# Patient Record
Sex: Male | Born: 1980 | Race: Black or African American | Hispanic: No | Marital: Single | Smoking: Current every day smoker
Health system: Southern US, Community
[De-identification: ages and names within clinical notes are randomized; demographics above are authoritative.]

---

## 2002-09-14 ENCOUNTER — Emergency Department (HOSPITAL_COMMUNITY): Admission: EM | Admit: 2002-09-14 | Discharge: 2002-09-14 | Payer: Self-pay | Admitting: Emergency Medicine

## 2005-01-31 ENCOUNTER — Emergency Department (HOSPITAL_COMMUNITY): Admission: EM | Admit: 2005-01-31 | Discharge: 2005-01-31 | Payer: Self-pay | Admitting: Emergency Medicine

## 2005-02-21 ENCOUNTER — Encounter: Admission: RE | Admit: 2005-02-21 | Discharge: 2005-03-04 | Payer: Self-pay | Admitting: Orthopedic Surgery

## 2007-06-10 ENCOUNTER — Emergency Department (HOSPITAL_COMMUNITY): Admission: EM | Admit: 2007-06-10 | Discharge: 2007-06-10 | Payer: Self-pay | Admitting: Family Medicine

## 2007-12-29 ENCOUNTER — Emergency Department (HOSPITAL_COMMUNITY): Admission: EM | Admit: 2007-12-29 | Discharge: 2007-12-29 | Payer: Self-pay | Admitting: Emergency Medicine

## 2009-03-20 ENCOUNTER — Emergency Department (HOSPITAL_COMMUNITY): Admission: EM | Admit: 2009-03-20 | Discharge: 2009-03-20 | Payer: Self-pay | Admitting: Emergency Medicine

## 2009-12-26 ENCOUNTER — Emergency Department (HOSPITAL_COMMUNITY): Admission: EM | Admit: 2009-12-26 | Discharge: 2009-12-26 | Payer: Self-pay | Admitting: Emergency Medicine

## 2011-01-04 LAB — GC/CHLAMYDIA PROBE AMP, GENITAL
Chlamydia, DNA Probe: NEGATIVE
GC Probe Amp, Genital: NEGATIVE

## 2011-11-27 IMAGING — CR DG FOREARM 2V*L*
2 series · 2 of 2 positions shown · non-contrast
Comparison: None

CLINICAL DATA: Forearm stab wound. Stabbed by a kitchen knife.
Laceration.

LEFT FOREARM - 2 VIEW

[x forearm ap left]
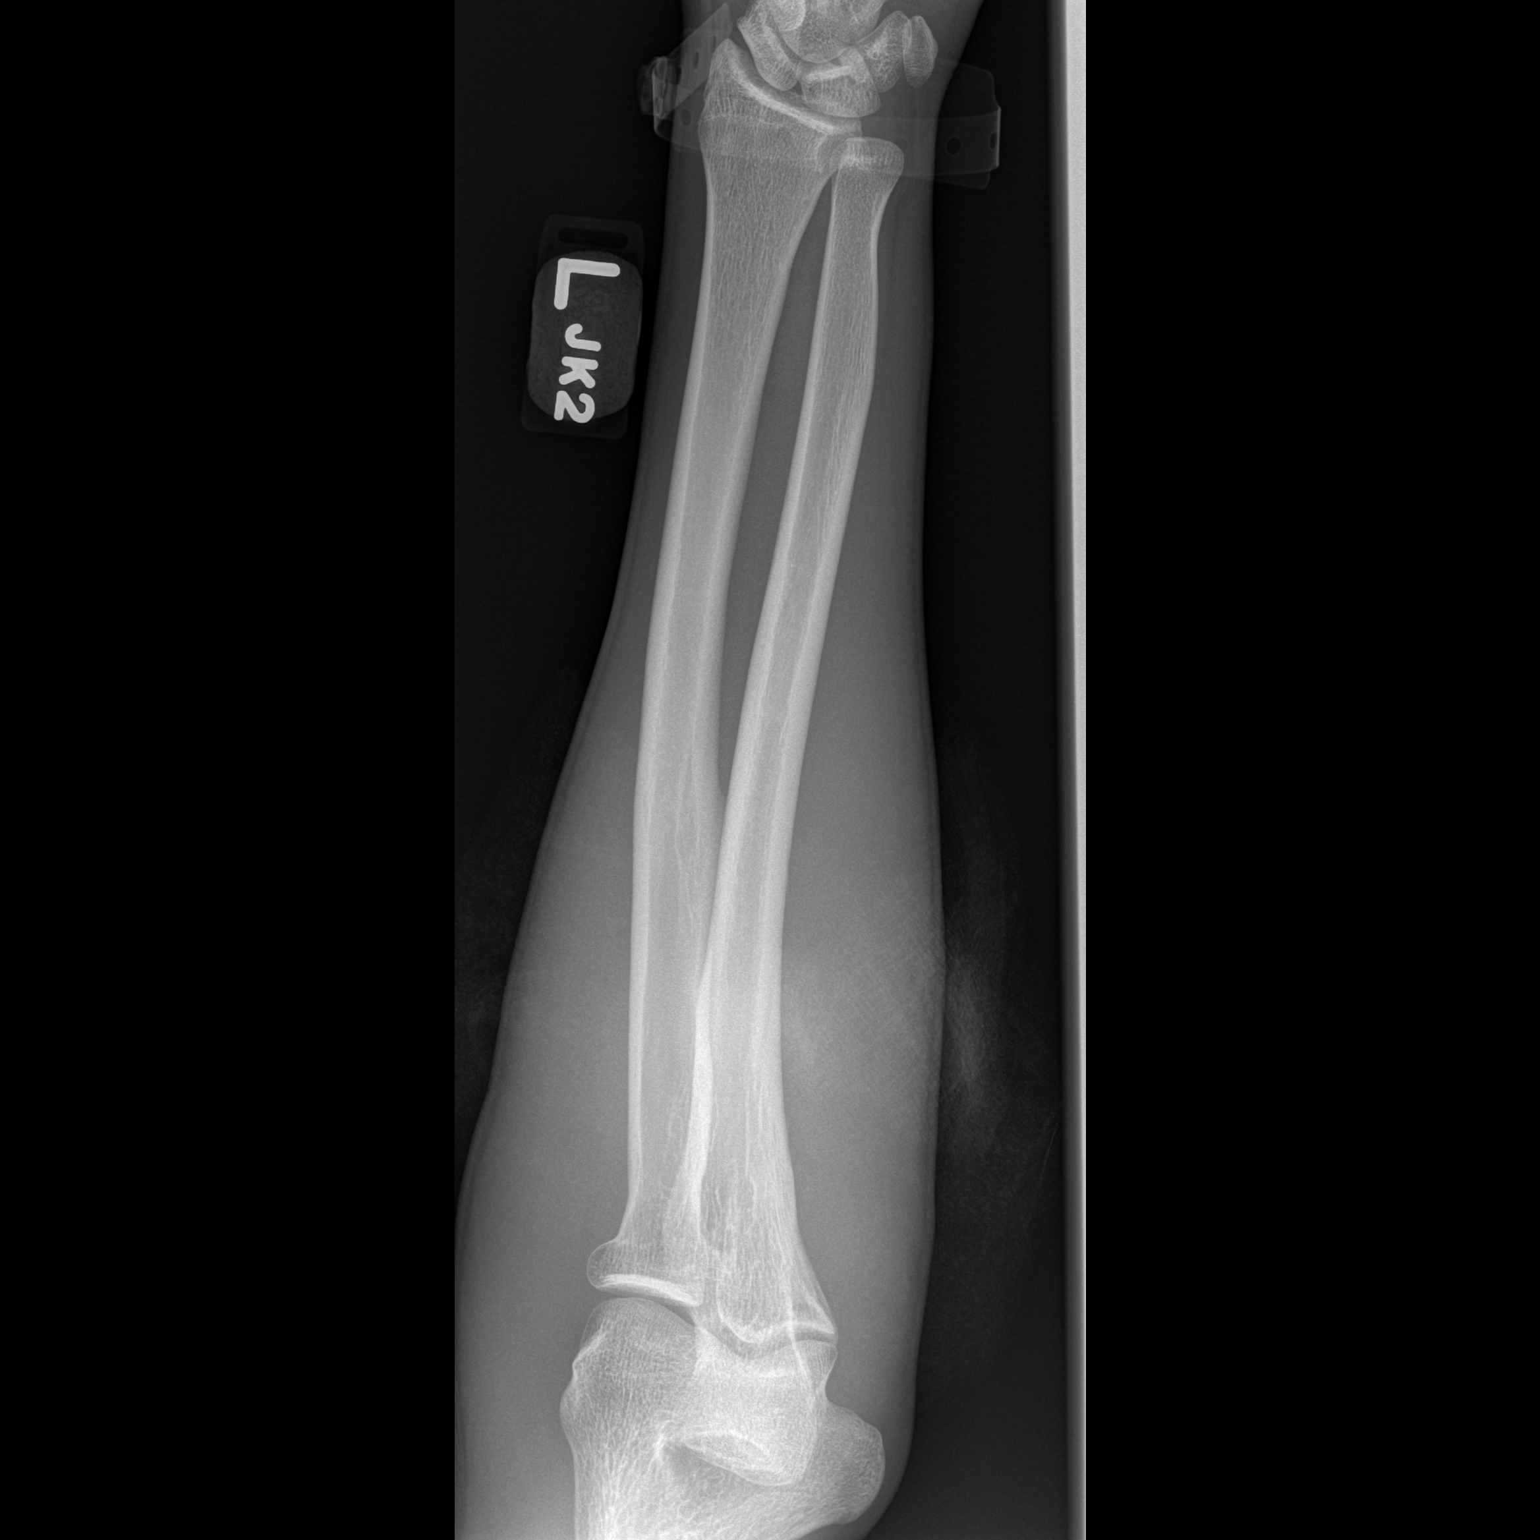

[x forearm lat left]
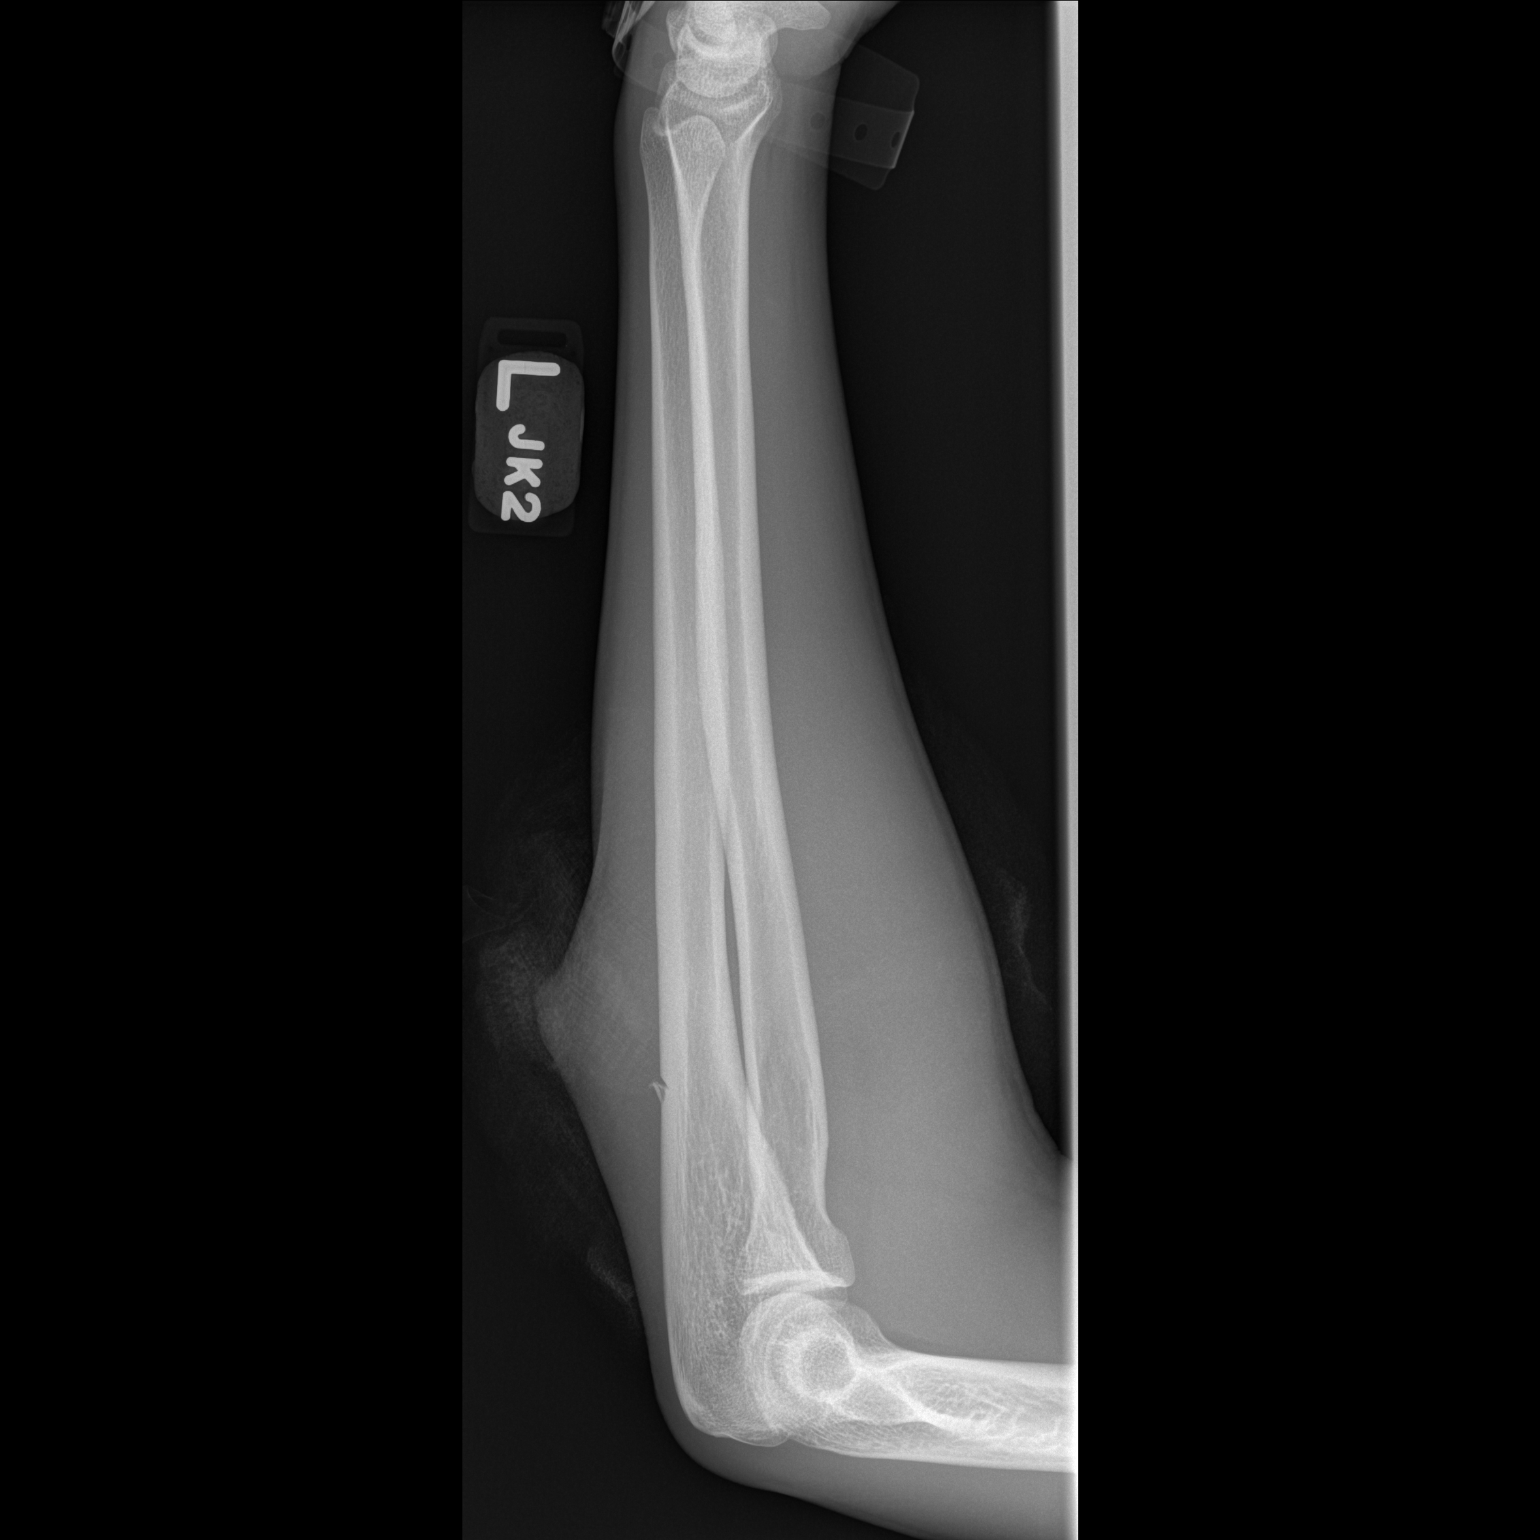

[2 of 2 positions shown; findings below may reference images not displayed]

FINDINGS: There is significant soft tissue swelling along the
dorsal aspect of the proximal forearm.  Within this region, there
is a small chip fracture of the dorsal aspect of the ulna.  This
does not appear to be a full-thickness fracture.  The radius
appears intact.  No radiopaque foreign body identified.
IMPRESSION: Laceration of the forearm associated with small chip fracture of
the ulna.

## 2013-10-29 ENCOUNTER — Emergency Department (INDEPENDENT_AMBULATORY_CARE_PROVIDER_SITE_OTHER)
Admission: EM | Admit: 2013-10-29 | Discharge: 2013-10-29 | Disposition: A | Discharge status: Home / Self Care | Payer: Self-pay | Source: Home / Self Care | Attending: Emergency Medicine | Admitting: Emergency Medicine

## 2013-10-29 ENCOUNTER — Encounter (HOSPITAL_COMMUNITY): Payer: Self-pay | Admitting: Emergency Medicine

## 2013-10-29 DIAGNOSIS — S0180XA Unspecified open wound of other part of head, initial encounter: Secondary | ICD-10-CM

## 2013-10-29 DIAGNOSIS — S01119A Laceration without foreign body of unspecified eyelid and periocular area, initial encounter: Secondary | ICD-10-CM

## 2013-10-29 DIAGNOSIS — S0181XA Laceration without foreign body of other part of head, initial encounter: Principal | ICD-10-CM

## 2013-10-29 MED ORDER — CEPHALEXIN 500 MG PO CAPS: 500.0000 mg | ORAL_CAPSULE | Freq: Three times a day (TID) | ORAL | Status: DC

## 2013-10-29 NOTE — ED Provider Notes (Signed)
CSN: [id number]     Arrival date & time 10/29/13  1932 History   First MD Initiated Contact with Patient 10/29/13 2027     Chief Complaint  Patient presents with  . Facial Laceration   (Consider location/radiation/quality/duration/timing/severity/associated sxs/prior Treatment) HPI Comments: 33 year old male presents for evaluation of a laceration above his left eye sustained while at work earlier today. He was struck in the face by a metal beam when he was walking, trying to hurry to finish his work. He did not get not felt. No pain with eye movements or blurry vision. No systemic symptoms since this happened. Bleeding controlled with direct pressure. His tetanus is up-to-date.   History reviewed. No pertinent past medical history. History reviewed. No pertinent past surgical history. History reviewed. No pertinent family history. History  Substance Use Topics  . Smoking status: Current Every Day Smoker  . Smokeless tobacco: Not on file  . Alcohol Use: No    Review of Systems  Skin: Positive for wound.  All other systems reviewed and are negative.   Allergies  Review of patient's allergies indicates no known allergies.  Home Medications   Prior to Admission medications   Medication Sig Start Date End Date Taking? Authorizing Provider  cephALEXin (KEFLEX) 500 MG capsule Take 1 capsule (500 mg total) by mouth 3 (three) times daily. 10/29/13   Adrian Blackwater Lenola Lockner, PA-C   BP 125/70  Pulse 92  Temp(Src) 99.5 F (37.5 C) (Oral)  Resp 14  SpO2 98% Physical Exam  Nursing note and vitals reviewed. Constitutional: He is oriented to person, place, and time. He appears well-developed and well-nourished. No distress.  HENT:  Head: Normocephalic. Head is with laceration (full thickness, minimal active bleeding).    No pain with palpation of the orbital rhythm. No pain with extraocular movements, and all extraocular movements intact.  Pulmonary/Chest: Effort normal. No respiratory  distress.  Neurological: He is alert and oriented to person, place, and time. Coordination normal.  Skin: Skin is warm and dry. No rash noted. He is not diaphoretic.  Psychiatric: He has a normal mood and affect. Judgment normal.    ED Course  LACERATION REPAIR Date/Time: 10/30/2013 9:18 AM Performed by: Autumn Messing, H Authorized by: Autumn Messing, H Consent: Verbal consent obtained. Risks and benefits: risks, benefits and alternatives were discussed Consent given by: patient Patient understanding: patient states understanding of the procedure being performed Patient identity confirmed: verbally with patient Time out: Immediately prior to procedure a "time out" was called to verify the correct patient, procedure, equipment, support staff and site/side marked as required. Body area: head/neck Location details: left eyebrow Laceration length: 5 cm Foreign bodies: no foreign bodies Tendon involvement: none Nerve involvement: none Vascular damage: no Anesthesia: local infiltration Local anesthetic: lidocaine 2% with epinephrine Anesthetic total: 7 ml Preparation: Patient was prepped and draped in the usual sterile fashion. Irrigation solution: saline Irrigation method: syringe Amount of cleaning: standard Debridement: none Degree of undermining: none Skin closure: 6-0 Prolene Number of sutures: 7 Technique: simple Approximation: close Approximation difficulty: simple Dressing: antibiotic ointment and 4x4 sterile gauze Patient tolerance: Patient tolerated the procedure well with no immediate complications.   (including critical care time) Labs Review Labs Reviewed - No data to display  Imaging Review No results found.   MDM   1. Laceration of eyebrow and forehead, initial encounter    Keflex for wound infection prophylaxis. Followup in 5-7 days for removal of sutures.  Meds ordered this encounter  Medications  .  cephALEXin (KEFLEX) 500 MG capsule    Sig: Take 1  capsule (500 mg total) by mouth 3 (three) times daily.    Dispense:  15 capsule    Refill:  0    Order Specific Question:  Supervising Provider    Answer:  Clementeen GrahamOREY, EVAN, Kathie RhodesS [[id number]]     Graylon GoodZachary H Jewelianna Pancoast, PA-C 10/30/13 1913

## 2013-10-29 NOTE — Discharge Instructions (Signed)
Facial Laceration  A facial laceration is a cut on the face. These injuries can be painful and cause bleeding. Lacerations usually heal quickly, but they need special care to reduce scarring. DIAGNOSIS  Your health care provider will take a medical history, ask for details about how the injury occurred, and examine the wound to determine how deep the cut is. TREATMENT  Some facial lacerations may not require closure. Others may not be able to be closed because of an increased risk of infection. The risk of infection and the chance for successful closure will depend on various factors, including the amount of time since the injury occurred. The wound may be cleaned to help prevent infection. If closure is appropriate, pain medicines may be given if needed. Your health care provider will use stitches (sutures), wound glue (adhesive), or skin adhesive strips to repair the laceration. These tools bring the skin edges together to allow for faster healing and a better cosmetic outcome. If needed, you may also be given a tetanus shot. HOME CARE INSTRUCTIONS  Only take over-the-counter or prescription medicines as directed by your health care provider.  Follow your health care provider's instructions for wound care. These instructions will vary depending on the technique used for closing the wound. For Sutures:  Keep the wound clean and dry.   If you were given a bandage (dressing), you should change it at least once a day. Also change the dressing if it becomes wet or dirty, or as directed by your health care provider.   Wash the wound with soap and water 2 times a day. Rinse the wound off with water to remove all soap. Pat the wound dry with a clean towel.   After cleaning, apply a thin layer of the antibiotic ointment recommended by your health care provider. This will help prevent infection and keep the dressing from sticking.   You may shower as usual after the first 24 hours. Do not soak the  wound in water until the sutures are removed.   Get your sutures removed as directed by your health care provider. With facial lacerations, sutures should usually be taken out after 4-5 days to avoid stitch marks.   Wait a few days after your sutures are removed before applying any makeup. For Skin Adhesive Strips:  Keep the wound clean and dry.   Do not get the skin adhesive strips wet. You may bathe carefully, using caution to keep the wound dry.   If the wound gets wet, pat it dry with a clean towel.   Skin adhesive strips will fall off on their own. You may trim the strips as the wound heals. Do not remove skin adhesive strips that are still stuck to the wound. They will fall off in time.  For Wound Adhesive:  You may briefly wet your wound in the shower or bath. Do not soak or scrub the wound. Do not swim. Avoid periods of heavy sweating until the skin adhesive has fallen off on its own. After showering or bathing, gently pat the wound dry with a clean towel.   Do not apply liquid medicine, cream medicine, ointment medicine, or makeup to your wound while the skin adhesive is in place. This may loosen the film before your wound is healed.   If a dressing is placed over the wound, be careful not to apply tape directly over the skin adhesive. This may cause the adhesive to be pulled off before the wound is healed.   Avoid   prolonged exposure to sunlight or tanning lamps while the skin adhesive is in place.  The skin adhesive will usually remain in place for 5-10 days, then naturally fall off the skin. Do not pick at the adhesive film.  After Healing: Once the wound has healed, cover the wound with sunscreen during the day for 1 full year. This can help minimize scarring. Exposure to ultraviolet light in the first year will darken the scar. It can take 1-2 years for the scar to lose its redness and to heal completely.  SEEK IMMEDIATE MEDICAL CARE IF:  You have redness, pain, or  swelling around the wound.   You see ayellowish-white fluid (pus) coming from the wound.   You have chills or a fever.  MAKE SURE YOU:  Understand these instructions.  Will watch your condition.  Will get help right away if you are not doing well or get worse. Document Released: 05/09/2004 Document Revised: 01/20/2013 Document Reviewed: 11/12/2012 Exit[employer] Patient Information 2015 [employer]. This information is not intended to replace advice given to you by your health care provider. Make sure you discuss any questions you have with your health care provider.  

## 2013-10-29 NOTE — ED Notes (Signed)
Pt  Reports  He  Was  Struck  In the   Face  By a  Metal  Beam  And  Sustained  A  Laceration  To  The  Left  eyenbrow  Area  -  He  Was  Not  Knocked  Out  No  Bleeding  He  Is  Awake  And  Alert and oriented  Bleeding  Has  Subsided

## 2013-10-31 NOTE — ED Provider Notes (Signed)
Medical screening examination/treatment/procedure(s) were performed by non-physician practitioner and as supervising physician I was immediately available for consultation/collaboration.  Dyanna Seiter, M.D.  Jolon Degante C Kamesha Herne, MD 10/31/13 0851 

## 2016-04-09 ENCOUNTER — Encounter (HOSPITAL_COMMUNITY): Payer: Self-pay | Admitting: Family Medicine

## 2016-04-09 ENCOUNTER — Ambulatory Visit (HOSPITAL_COMMUNITY)
Admission: EM | Admit: 2016-04-09 | Discharge: 2016-04-09 | Disposition: A | Discharge status: Home / Self Care | Payer: Self-pay | Attending: Emergency Medicine | Admitting: Emergency Medicine

## 2016-04-09 DIAGNOSIS — F17200 Nicotine dependence, unspecified, uncomplicated: Secondary | ICD-10-CM | POA: Insufficient documentation

## 2016-04-09 DIAGNOSIS — Z202 Contact with and (suspected) exposure to infections with a predominantly sexual mode of transmission: Secondary | ICD-10-CM | POA: Insufficient documentation

## 2016-04-09 MED ORDER — METRONIDAZOLE 500 MG PO TABS: 500.0000 mg | ORAL_TABLET | Freq: Two times a day (BID) | ORAL | 0 refills | Status: AC

## 2016-04-09 NOTE — ED Triage Notes (Signed)
Pt here for STD check.

## 2016-04-09 NOTE — ED Provider Notes (Signed)
[facility]    CSN: [id number] Arrival date & time: 04/09/16  1947     History   Chief Complaint Chief Complaint  Patient presents with  . Exposure to STD    HPI Frederick Vaughn is a 35 y.o. male.   The history is provided by the patient.  He is a 35 year old man here for STD exposure. He was contacted by a previous partner and told to get tested for trichomonas. He denies any symptoms currently.  History reviewed. No pertinent past medical history.  There are no active problems to display for this patient.   History reviewed. No pertinent surgical history.     Home Medications    Prior to Admission medications   Medication Sig Start Date End Date Taking? Authorizing Provider  metroNIDAZOLE (FLAGYL) 500 MG tablet Take 1 tablet (500 mg total) by mouth 2 (two) times daily. 04/09/16   Charm RingsErin J Talise Sligh, MD    Family History History reviewed. No pertinent family history.  Social History Social History  Substance Use Topics  . Smoking status: Current Every Day Smoker  . Smokeless tobacco: Never Used  . Alcohol use No     Allergies   Patient has no known allergies.   Review of Systems Review of Systems As in history of present illness  Physical Exam Triage Vital Signs ED Triage Vitals [04/09/16 1953]  Enc Vitals Group     BP 144/77     Pulse Rate 80     Resp 16     Temp 98.6 F (37 C)     Temp Source Oral     SpO2 99 %     Weight      Height      Head Circumference      Peak Flow      Pain Score      Pain Loc      Pain Edu?      Excl. in GC?    No data found.   Updated Vital Signs BP 144/77 (BP Location: Left Arm)   Pulse 80   Temp 98.6 F (37 C) (Oral)   Resp 16   SpO2 99%   Visual Acuity Right Eye Distance:   Left Eye Distance:   Bilateral Distance:    Right Eye Near:   Left Eye Near:    Bilateral Near:     Physical Exam  Constitutional: He is oriented to person, place, and time. He appears well-developed and  well-nourished. No distress.  Cardiovascular: Normal rate.   Pulmonary/Chest: Effort normal.  Neurological: He is alert and oriented to person, place, and time.     UC Treatments / Results  Labs (all labs ordered are listed, but only abnormal results are displayed) Labs Reviewed  URINE CYTOLOGY ANCILLARY ONLY    EKG  EKG Interpretation None       Radiology No results found.  Procedures Procedures (including critical care time)  Medications Ordered in UC Medications - No data to display   Initial Impression / Assessment and Plan / UC Course  I have reviewed the triage vital signs and the nursing notes.  Pertinent labs & imaging results that were available during my care of the patient were reviewed by me and considered in my medical decision making (see chart for details).  Clinical Course     We'll send urine for testing. Start Flagyl twice a day while awaiting results. Follow-up as needed.  Final Clinical Impressions(s) / UC Diagnoses   Final  diagnoses:  STD exposure    New Prescriptions New Prescriptions   METRONIDAZOLE (FLAGYL) 500 MG TABLET    Take 1 tablet (500 mg total) by mouth 2 (two) times daily.     Charm RingsErin J Dema Timmons, MD 04/09/16 2006

## 2016-04-09 NOTE — Discharge Instructions (Signed)
We are going to test her urine for STDs. We will get the results back in 2-3 days. Take Flagyl twice a day for 7 days. Do not drink alcohol while on this medicine. Follow-up as needed.

## 2016-04-10 LAB — URINE CYTOLOGY ANCILLARY ONLY
Chlamydia: POSITIVE — AB
Neisseria Gonorrhea: NEGATIVE
Trichomonas: NEGATIVE

## 2016-04-12 ENCOUNTER — Telehealth: Payer: Self-pay | Admitting: Internal Medicine

## 2016-04-12 MED ORDER — AZITHROMYCIN 500 MG PO TABS: 1000.0000 mg | ORAL_TABLET | Freq: Once | ORAL | 0 refills | Status: AC

## 2016-04-12 NOTE — Telephone Encounter (Signed)
Clinical staff, please let patient and health department know that test for chlamydia was positive.  Rx zithromax sent to pharmacy of record, [facility] [address] [facility] [address].  Sexual partners need to be notified and tested/treated.  Recheck for further evaluation as needed if symptoms persist.  LM
